# Patient Record
Sex: Female | Born: 1990 | Race: White | Hispanic: No | Marital: Single | Smoking: Never smoker
Health system: Southern US, Community
[De-identification: ages and names within clinical notes are randomized; demographics above are authoritative.]

## PROBLEM LIST (undated history)

## (undated) ENCOUNTER — Inpatient Hospital Stay (HOSPITAL_COMMUNITY): Payer: Self-pay

## (undated) DIAGNOSIS — C9590 Leukemia, unspecified not having achieved remission: Secondary | ICD-10-CM

## (undated) HISTORY — PX: PORT-A-CATH REMOVAL: SHX5289

## (undated) HISTORY — PX: EYE SURGERY: SHX253

---

## 2009-12-15 ENCOUNTER — Inpatient Hospital Stay (HOSPITAL_COMMUNITY): Admission: AD | Admit: 2009-12-15 | Discharge: 2009-12-15 | Payer: Self-pay | Admitting: Obstetrics & Gynecology

## 2009-12-25 ENCOUNTER — Inpatient Hospital Stay (HOSPITAL_COMMUNITY): Admission: AD | Admit: 2009-12-25 | Discharge: 2009-12-28 | Payer: Self-pay | Admitting: Obstetrics and Gynecology

## 2010-11-22 LAB — COMPREHENSIVE METABOLIC PANEL
ALT: 39 U/L — ABNORMAL HIGH (ref 0–35)
ALT: 46 U/L — ABNORMAL HIGH (ref 0–35)
ALT: 54 U/L — ABNORMAL HIGH (ref 0–35)
AST: 27 U/L (ref 0–37)
Alkaline Phosphatase: 115 U/L (ref 39–117)
Alkaline Phosphatase: 78 U/L (ref 39–117)
Alkaline Phosphatase: 85 U/L (ref 39–117)
BUN: 4 mg/dL — ABNORMAL LOW (ref 6–23)
BUN: 7 mg/dL (ref 6–23)
CO2: 19 mEq/L (ref 19–32)
CO2: 22 mEq/L (ref 19–32)
CO2: 23 mEq/L (ref 19–32)
Calcium: 8.4 mg/dL (ref 8.4–10.5)
Chloride: 105 mEq/L (ref 96–112)
Chloride: 105 mEq/L (ref 96–112)
GFR calc Af Amer: >60 mL/min (ref >60)
GFR calc non Af Amer: >60 mL/min (ref >60)
GFR calc non Af Amer: >60 mL/min (ref >60)
GFR calc non Af Amer: >60 mL/min (ref >60)
Glucose, Bld: 90 mg/dL (ref 70–99)
Glucose, Bld: 95 mg/dL (ref 70–99)
Potassium: 3.6 mEq/L (ref 3.5–5.1)
Potassium: 3.6 mEq/L (ref 3.5–5.1)
Potassium: 3.9 mEq/L (ref 3.5–5.1)
Sodium: 134 mEq/L — ABNORMAL LOW (ref 135–145)
Sodium: 135 mEq/L (ref 135–145)
Sodium: 136 mEq/L (ref 135–145)
Total Bilirubin: 0.3 mg/dL (ref 0.3–1.2)
Total Bilirubin: 0.3 mg/dL (ref 0.3–1.2)
Total Protein: 4.8 g/dL — ABNORMAL LOW (ref 6.0–8.3)

## 2010-11-22 LAB — CBC
HCT: 27.9 % — ABNORMAL LOW (ref 36.0–46.0)
HCT: 39.5 % (ref 36.0–46.0)
Hemoglobin: 13.5 g/dL (ref 12.0–15.0)
Hemoglobin: 9.8 g/dL — ABNORMAL LOW (ref 12.0–15.0)
MCHC: 34.4 g/dL (ref 30.0–36.0)
MCV: 90.8 fL (ref 78.0–100.0)
RBC: 3.03 MIL/uL — ABNORMAL LOW (ref 3.87–5.11)
RBC: 3.28 MIL/uL — ABNORMAL LOW (ref 3.87–5.11)
RDW: 13.1 % (ref 11.5–15.5)
WBC: 20.8 10*3/uL — ABNORMAL HIGH (ref 4.0–10.5)
WBC: 22.4 10*3/uL — ABNORMAL HIGH (ref 4.0–10.5)

## 2010-11-22 LAB — HEPATIC FUNCTION PANEL
Bilirubin, Direct: 0.1 mg/dL (ref 0.0–0.3)
Indirect Bilirubin: 0.1 mg/dL — ABNORMAL LOW (ref 0.3–0.9)
Total Bilirubin: 0.2 mg/dL — ABNORMAL LOW (ref 0.3–1.2)

## 2010-11-22 LAB — RH IMMUNE GLOB WKUP(>/=20WKS)(NOT WOMEN'S HOSP): Fetal Screen: NEGATIVE

## 2010-11-22 LAB — LACTATE DEHYDROGENASE: LDH: 168 U/L (ref 94–250)

## 2010-11-22 LAB — ABO/RH: ABO/RH(D): A NEG

## 2010-11-23 LAB — CBC
HCT: 38.8 % (ref 36.0–46.0)
Hemoglobin: 13.5 g/dL (ref 12.0–15.0)
MCV: 91.1 fL (ref 78.0–100.0)
Platelets: 211 10*3/uL (ref 150–400)
RDW: 12.9 % (ref 11.5–15.5)

## 2010-11-23 LAB — URINE MICROSCOPIC-ADD ON

## 2010-11-23 LAB — DIFFERENTIAL
Basophils Absolute: 0 10*3/uL (ref 0.0–0.1)
Basophils Relative: 0 % (ref 0–1)
Lymphocytes Relative: 5 % — ABNORMAL LOW (ref 12–46)
Monocytes Relative: 5 % (ref 3–12)
Neutro Abs: 12.4 10*3/uL — ABNORMAL HIGH (ref 1.7–7.7)
Neutrophils Relative %: 91 % — ABNORMAL HIGH (ref 43–77)

## 2010-11-23 LAB — URINALYSIS, ROUTINE W REFLEX MICROSCOPIC
Glucose, UA: NEGATIVE mg/dL
Ketones, ur: NEGATIVE mg/dL
Nitrite: NEGATIVE
Protein, ur: 100 mg/dL — AB
Urobilinogen, UA: 1 mg/dL (ref 0.0–1.0)

## 2010-11-23 LAB — URINE CULTURE: Colony Count: >=100000

## 2010-11-23 LAB — COMPREHENSIVE METABOLIC PANEL
Alkaline Phosphatase: 102 U/L (ref 39–117)
BUN: 7 mg/dL (ref 6–23)
Creatinine, Ser: 0.59 mg/dL (ref 0.4–1.2)
Glucose, Bld: 130 mg/dL — ABNORMAL HIGH (ref 70–99)
Potassium: 3.6 mEq/L (ref 3.5–5.1)
Total Bilirubin: 0.3 mg/dL (ref 0.3–1.2)
Total Protein: 5.8 g/dL — ABNORMAL LOW (ref 6.0–8.3)

## 2012-07-02 ENCOUNTER — Emergency Department (HOSPITAL_COMMUNITY)
Admission: EM | Admit: 2012-07-02 | Discharge: 2012-07-03 | Disposition: A | Discharge status: Home / Self Care | Payer: BC Managed Care – PPO | Attending: Emergency Medicine | Admitting: Emergency Medicine

## 2012-07-02 ENCOUNTER — Encounter (HOSPITAL_COMMUNITY): Payer: Self-pay

## 2012-07-02 DIAGNOSIS — M549 Dorsalgia, unspecified: Secondary | ICD-10-CM | POA: Insufficient documentation

## 2012-07-02 DIAGNOSIS — O9989 Other specified diseases and conditions complicating pregnancy, childbirth and the puerperium: Secondary | ICD-10-CM | POA: Insufficient documentation

## 2012-07-02 DIAGNOSIS — Z859 Personal history of malignant neoplasm, unspecified: Secondary | ICD-10-CM | POA: Insufficient documentation

## 2012-07-02 DIAGNOSIS — R109 Unspecified abdominal pain: Secondary | ICD-10-CM | POA: Insufficient documentation

## 2012-07-02 DIAGNOSIS — O210 Mild hyperemesis gravidarum: Secondary | ICD-10-CM | POA: Insufficient documentation

## 2012-07-02 HISTORY — DX: Leukemia, unspecified not having achieved remission: C95.90

## 2012-07-02 LAB — URINALYSIS, ROUTINE W REFLEX MICROSCOPIC
Hgb urine dipstick: NEGATIVE
Ketones, ur: >80 mg/dL — AB
Protein, ur: 30 mg/dL — AB
Urobilinogen, UA: 1 mg/dL (ref 0.0–1.0)

## 2012-07-02 LAB — BASIC METABOLIC PANEL
BUN: 8 mg/dL (ref 6–23)
Calcium: 9 mg/dL (ref 8.4–10.5)
Creatinine, Ser: 0.79 mg/dL (ref 0.50–1.10)
GFR calc non Af Amer: >90 mL/min (ref >90)
Glucose, Bld: 83 mg/dL (ref 70–99)

## 2012-07-02 LAB — URINE MICROSCOPIC-ADD ON

## 2012-07-02 MED ORDER — ONDANSETRON HCL 4 MG/2ML IJ SOLN
4.0000 mg | Freq: Once | INTRAMUSCULAR | Status: AC
  Administered 2012-07-02: 4 mg via INTRAVENOUS
  Filled 2012-07-02: qty 2

## 2012-07-02 MED ORDER — OXYCODONE-ACETAMINOPHEN 5-325 MG PO TABS: 2.0000 | ORAL_TABLET | ORAL | Status: DC | PRN

## 2012-07-02 MED ORDER — MORPHINE SULFATE 4 MG/ML IJ SOLN
4.0000 mg | Freq: Once | INTRAMUSCULAR | Status: AC
  Administered 2012-07-02: 4 mg via INTRAVENOUS
  Filled 2012-07-02: qty 1

## 2012-07-02 MED ORDER — SODIUM CHLORIDE 0.9 % IV BOLUS (SEPSIS)
1000.0000 mL | Freq: Once | INTRAVENOUS | Status: AC
  Administered 2012-07-02: 1000 mL via INTRAVENOUS

## 2012-07-02 MED ORDER — PROMETHAZINE HCL 25 MG RE SUPP: 25.0000 mg | Freq: Four times a day (QID) | RECTAL | Status: DC | PRN

## 2012-07-02 NOTE — ED Provider Notes (Signed)
History     CSN: [id number]  Arrival date & time 07/02/12  2108   First MD Initiated Contact with Patient 07/02/12 2122      Chief Complaint  Patient presents with  . Emesis During Pregnancy    (Consider location/radiation/quality/duration/timing/severity/associated sxs/prior treatment) The history is provided by the patient.   21 year old, female , G2, P58, presents emergency department complaining of pain across her lower back, along with nausea and vomiting.  She was just diagnosed with pregnancy, 4 days ago.  She was treated for urinary tract infection, with antibiotics and analgesics.  However, she states that every time she tries to take a medication.  She vomits it back up.  She has not had a cough, or shortness she has not had diarrhea.  She denies dysuria, or hematuria.  Past Medical History  Diagnosis Date  . Leukemia     Past Surgical History  Procedure Date  . Eye surgery     x 4  . 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 No pertinent family history.  History  Substance Use Topics  . Smoking status: Never Smoker   . Smokeless tobacco: Not on file  . Alcohol Use: No    OB History    Grav Para Term Preterm Abortions TAB SAB Ect Mult Living   1               Review of Systems  Constitutional: Negative for fever and chills.  Respiratory: Negative for cough and shortness of breath.   Cardiovascular: Negative for chest pain.  Gastrointestinal: Positive for nausea and vomiting. Negative for abdominal pain and diarrhea.  Genitourinary: Positive for flank pain. Negative for dysuria and hematuria.  Musculoskeletal: Positive for back pain.  Skin: Negative for rash.  Neurological: Negative for headaches.  Psychiatric/Behavioral: Negative for confusion.  All other systems reviewed and are negative.    Allergies  Review of patient's allergies indicates no known allergies.  Home Medications   Current Outpatient Rx  Name  Route Sig Dispense Refill  . NITROFURANTOIN MACROCRYSTAL 100 MG PO CAPS Oral Take 100 mg by mouth 2 (two) times daily.    . OXYCODONE HCL 5 MG PO TABS Oral Take 5 mg by mouth every 4 (four) hours as needed. Pain      BP 131/81  Pulse 103  Temp 98.2 F (36.8 C) (Oral)  Resp 22  SpO2 100%  Physical Exam  Nursing note and vitals reviewed. Constitutional: She is oriented to person, place, and time. She appears well-developed and well-nourished. No distress.  HENT:  Head: Normocephalic and atraumatic.  Eyes: Conjunctivae normal and EOM are normal.  Neck: Normal range of motion. Neck supple.  Cardiovascular: Regular rhythm and intact distal pulses.   No murmur heard.      Tachycardia  Pulmonary/Chest: Effort normal and breath sounds normal. No respiratory distress.  Abdominal: Soft. Bowel sounds are normal. She exhibits no distension. There is no tenderness.  Genitourinary:       Mild bilateral CVA, tenderness  Musculoskeletal: Normal range of motion.  Neurological: She is alert and oriented to person, place, and time. No cranial nerve deficit.  Skin: Skin is warm and dry.  Psychiatric: She has a normal mood and affect. Thought content normal.    ED Course  Procedures (including critical care time) 21 year old, female, G2, P1, presents with hyperemesis.  She is not toxic.  She does not appear to be in any  significant distress.  We will establish an IV and give her antiemetics, and perform laboratory testing, to see if there is any evidence of significant dehydration.   Labs Reviewed  BASIC METABOLIC PANEL  URINALYSIS, ROUTINE W REFLEX MICROSCOPIC  HCG, QUANTITATIVE, PREGNANCY   No results found.   No diagnosis found.    MDM  Hyperemesis gravidarum        Cheri Guppy, MD 07/02/12 2215

## 2012-07-02 NOTE — ED Notes (Signed)
Pt went to urgent care and dx with kidney infection.  Pt then went to [facility] and dx with pregnancy and the kidney infection.  Meds were changed. ie narcotic/antibiotic.  No meds for nausea given for home meds.  Pt states continues to vomit since Saturday.  States ultrasound shows 17 week pregnancy with sex known.  Pt states she had been spotting and did not know she was pregnant.  Concerned for dehydration.  Unable to keep meds down.  Pain from left flank around abdomen.  No spotting.

## 2012-07-02 NOTE — ED Notes (Signed)
MD at bedside. Dr. Caporossi at bedside.  

## 2012-07-03 MED ORDER — CEPHALEXIN 500 MG PO CAPS: 500.0000 mg | ORAL_CAPSULE | Freq: Four times a day (QID) | ORAL | Status: DC

## 2012-09-04 NOTE — L&D Delivery Note (Signed)
Operative Delivery Note At 11:05 AM a viable and healthy female was delivered via Vaginal, Vacuum (Extractor) by Dr. Macon Large due to fetal heart rate decelerations in the 80's.  Presentation: vertex; Position: Right,, Occiput,, Anterior; Station: +3.  Verbal consent: obtained from patient.  Risks and benefits discussed in detail.  Risks include, but are not limited to the risks of anesthesia, bleeding, infection, damage to maternal tissues, fetal cephalhematoma.  There is also the risk of inability to effect vaginal delivery of the head, or shoulder dystocia that cannot be resolved by established maneuvers, leading to the need for emergency cesarean section.  APGAR: 7, 9; weight 7 lb 6 oz (3345 g).   Placenta status: Intact, Spontaneous.   Cord: 3 vessels with the following complications: None.  Cord pH: 7.217  Anesthesia:   Instruments: Epidural Episiotomy: None Lacerations: 3rd degree repaired by Dr. Macon Large Suture Repair: 3.0 Est. Blood Loss (mL): 400  Mom to postpartum.  Baby to nursery-stable.  [facility] 12/04/2012, 2:11 PM

## 2012-11-11 ENCOUNTER — Encounter (HOSPITAL_COMMUNITY): Payer: Self-pay

## 2012-11-11 ENCOUNTER — Inpatient Hospital Stay (HOSPITAL_COMMUNITY)
Admission: AD | Admit: 2012-11-11 | Discharge: 2012-11-11 | Disposition: A | Discharge status: Home / Self Care | Payer: BC Managed Care – PPO | Source: Ambulatory Visit | Attending: Obstetrics and Gynecology | Admitting: Obstetrics and Gynecology

## 2012-11-11 DIAGNOSIS — R109 Unspecified abdominal pain: Secondary | ICD-10-CM | POA: Insufficient documentation

## 2012-11-11 DIAGNOSIS — N390 Urinary tract infection, site not specified: Secondary | ICD-10-CM | POA: Insufficient documentation

## 2012-11-11 LAB — URINALYSIS, ROUTINE W REFLEX MICROSCOPIC
Bilirubin Urine: NEGATIVE
Glucose, UA: NEGATIVE mg/dL
Specific Gravity, Urine: 1.01 (ref 1.005–1.030)
pH: 6.5 (ref 5.0–8.0)

## 2012-11-11 LAB — URINE MICROSCOPIC-ADD ON

## 2012-11-11 MED ORDER — CEPHALEXIN 500 MG PO CAPS: 500.0000 mg | ORAL_CAPSULE | Freq: Four times a day (QID) | ORAL | Status: DC

## 2012-11-11 NOTE — MAU Note (Signed)
Patient states she has been having abdominal pain and lower abdominal pressure today. Denies bleeding or leaking and reports good fetal movement.

## 2012-12-03 ENCOUNTER — Inpatient Hospital Stay (HOSPITAL_COMMUNITY): Payer: BC Managed Care – PPO

## 2012-12-03 ENCOUNTER — Inpatient Hospital Stay (HOSPITAL_COMMUNITY): Payer: BC Managed Care – PPO | Admitting: Anesthesiology

## 2012-12-03 ENCOUNTER — Inpatient Hospital Stay (HOSPITAL_COMMUNITY)
Admission: AD | Admit: 2012-12-03 | Discharge: 2012-12-06 | DRG: 372 | Disposition: A | Discharge status: Home / Self Care | Payer: BC Managed Care – PPO | Source: Ambulatory Visit | Attending: Obstetrics & Gynecology | Admitting: Obstetrics & Gynecology

## 2012-12-03 ENCOUNTER — Encounter (HOSPITAL_COMMUNITY): Payer: Self-pay

## 2012-12-03 ENCOUNTER — Encounter (HOSPITAL_COMMUNITY): Payer: Self-pay | Admitting: Anesthesiology

## 2012-12-03 DIAGNOSIS — D649 Anemia, unspecified: Secondary | ICD-10-CM | POA: Diagnosis not present

## 2012-12-03 DIAGNOSIS — O1403 Mild to moderate pre-eclampsia, third trimester: Secondary | ICD-10-CM

## 2012-12-03 DIAGNOSIS — IMO0002 Reserved for concepts with insufficient information to code with codable children: Secondary | ICD-10-CM

## 2012-12-03 DIAGNOSIS — O368390 Maternal care for abnormalities of the fetal heart rate or rhythm, unspecified trimester, not applicable or unspecified: Secondary | ICD-10-CM

## 2012-12-03 DIAGNOSIS — O9903 Anemia complicating the puerperium: Secondary | ICD-10-CM | POA: Diagnosis not present

## 2012-12-03 LAB — COMPREHENSIVE METABOLIC PANEL
AST: 17 U/L (ref 0–37)
Albumin: 2.4 g/dL — ABNORMAL LOW (ref 3.5–5.2)
BUN: 10 mg/dL (ref 6–23)
Chloride: 105 mEq/L (ref 96–112)
Creatinine, Ser: 0.62 mg/dL (ref 0.50–1.10)
Potassium: 3.9 mEq/L (ref 3.5–5.1)
Total Protein: 6.3 g/dL (ref 6.0–8.3)

## 2012-12-03 LAB — RAPID URINE DRUG SCREEN, HOSP PERFORMED
Amphetamines: NOT DETECTED
Benzodiazepines: NOT DETECTED
Cocaine: NOT DETECTED
Opiates: NOT DETECTED

## 2012-12-03 LAB — CBC
HCT: 36.8 % (ref 36.0–46.0)
MCH: 30.4 pg (ref 26.0–34.0)
MCV: 87.4 fL (ref 78.0–100.0)
RBC: 4.21 MIL/uL (ref 3.87–5.11)
WBC: 11.9 10*3/uL — ABNORMAL HIGH (ref 4.0–10.5)

## 2012-12-03 LAB — URINE MICROSCOPIC-ADD ON

## 2012-12-03 LAB — PROTEIN / CREATININE RATIO, URINE
Protein Creatinine Ratio: 1.98 — ABNORMAL HIGH (ref 0.00–0.15)
Total Protein, Urine: 170 mg/dL

## 2012-12-03 LAB — URINALYSIS, ROUTINE W REFLEX MICROSCOPIC
Nitrite: NEGATIVE
Protein, ur: NEGATIVE mg/dL
Specific Gravity, Urine: 1.025 (ref 1.005–1.030)
Urobilinogen, UA: 0.2 mg/dL (ref 0.0–1.0)

## 2012-12-03 LAB — OB RESULTS CONSOLE GBS: GBS: NEGATIVE

## 2012-12-03 LAB — RAPID HIV SCREEN (WH-MAU): Rapid HIV Screen: NONREACTIVE

## 2012-12-03 LAB — TYPE AND SCREEN: Antibody Screen: NEGATIVE

## 2012-12-03 MED ORDER — NALBUPHINE SYRINGE 5 MG/0.5 ML
10.0000 mg | INJECTION | INTRAMUSCULAR | Status: DC | PRN
  Filled 2012-12-03: qty 1

## 2012-12-03 MED ORDER — FENTANYL 2.5 MCG/ML BUPIVACAINE 1/10 % EPIDURAL INFUSION (WH - ANES)
14.0000 mL/h | INTRAMUSCULAR | Status: DC | PRN
  Administered 2012-12-03 – 2012-12-04 (×2): 14 mL/h via EPIDURAL
  Filled 2012-12-03 (×2): qty 125

## 2012-12-03 MED ORDER — LACTATED RINGERS IV SOLN
INTRAVENOUS | Status: DC
  Administered 2012-12-03 – 2012-12-04 (×4): via INTRAVENOUS

## 2012-12-03 MED ORDER — CITRIC ACID-SODIUM CITRATE 334-500 MG/5ML PO SOLN
30.0000 mL | ORAL | Status: DC | PRN
  Filled 2012-12-03: qty 15

## 2012-12-03 MED ORDER — TERBUTALINE SULFATE 1 MG/ML IJ SOLN
0.2500 mg | Freq: Once | INTRAMUSCULAR | Status: AC | PRN
  Filled 2012-12-03 (×2): qty 1

## 2012-12-03 MED ORDER — MAGNESIUM SULFATE BOLUS VIA INFUSION
4.0000 g | Freq: Once | INTRAVENOUS | Status: AC
  Administered 2012-12-03: 4 g via INTRAVENOUS
  Filled 2012-12-03: qty 500

## 2012-12-03 MED ORDER — IBUPROFEN 600 MG PO TABS: 600.0000 mg | ORAL_TABLET | Freq: Four times a day (QID) | ORAL | Status: DC | PRN

## 2012-12-03 MED ORDER — MAGNESIUM SULFATE 40 G IN LACTATED RINGERS - SIMPLE
2.0000 g/h | INTRAVENOUS | Status: DC
  Administered 2012-12-03: 2 g/h via INTRAVENOUS
  Filled 2012-12-03: qty 500

## 2012-12-03 MED ORDER — OXYCODONE-ACETAMINOPHEN 5-325 MG PO TABS: 1.0000 | ORAL_TABLET | ORAL | Status: DC | PRN

## 2012-12-03 MED ORDER — ACETAMINOPHEN 325 MG PO TABS: 650.0000 mg | ORAL_TABLET | ORAL | Status: DC | PRN

## 2012-12-03 MED ORDER — LACTATED RINGERS IV SOLN
500.0000 mL | INTRAVENOUS | Status: DC | PRN
  Administered 2012-12-03: 300 mL via INTRAVENOUS
  Administered 2012-12-04 (×3): 500 mL via INTRAVENOUS

## 2012-12-03 MED ORDER — OXYTOCIN 40 UNITS IN LACTATED RINGERS INFUSION - SIMPLE MED
62.5000 mL/h | INTRAVENOUS | Status: DC
  Filled 2012-12-03: qty 1000

## 2012-12-03 MED ORDER — LIDOCAINE HCL (PF) 1 % IJ SOLN
30.0000 mL | INTRAMUSCULAR | Status: DC | PRN
  Filled 2012-12-03 (×2): qty 30

## 2012-12-03 MED ORDER — MISOPROSTOL 25 MCG QUARTER TABLET
25.0000 ug | ORAL_TABLET | ORAL | Status: DC | PRN
  Administered 2012-12-03: 25 ug via VAGINAL
  Filled 2012-12-03: qty 1
  Filled 2012-12-03: qty 0.25

## 2012-12-03 MED ORDER — EPHEDRINE 5 MG/ML INJ
10.0000 mg | INTRAVENOUS | Status: DC | PRN
  Filled 2012-12-03: qty 2

## 2012-12-03 MED ORDER — PHENYLEPHRINE 40 MCG/ML (10ML) SYRINGE FOR IV PUSH (FOR BLOOD PRESSURE SUPPORT)
80.0000 ug | PREFILLED_SYRINGE | INTRAVENOUS | Status: DC | PRN
  Filled 2012-12-03: qty 5
  Filled 2012-12-03: qty 2

## 2012-12-03 MED ORDER — PHENYLEPHRINE 40 MCG/ML (10ML) SYRINGE FOR IV PUSH (FOR BLOOD PRESSURE SUPPORT)
80.0000 ug | PREFILLED_SYRINGE | INTRAVENOUS | Status: DC | PRN
  Administered 2012-12-03: 80 ug via INTRAVENOUS
  Filled 2012-12-03: qty 2

## 2012-12-03 MED ORDER — FLEET ENEMA 7-19 GM/118ML RE ENEM: 1.0000 | ENEMA | RECTAL | Status: DC | PRN

## 2012-12-03 MED ORDER — SODIUM BICARBONATE 8.4 % IV SOLN
INTRAVENOUS | Status: DC | PRN
  Administered 2012-12-03: 5 mL via EPIDURAL

## 2012-12-03 MED ORDER — DIPHENHYDRAMINE HCL 50 MG/ML IJ SOLN: 12.5000 mg | INTRAMUSCULAR | Status: DC | PRN

## 2012-12-03 MED ORDER — OXYTOCIN BOLUS FROM INFUSION: 500.0000 mL | INTRAVENOUS | Status: DC

## 2012-12-03 MED ORDER — ONDANSETRON HCL 4 MG/2ML IJ SOLN
4.0000 mg | Freq: Four times a day (QID) | INTRAMUSCULAR | Status: DC | PRN
  Administered 2012-12-04: 4 mg via INTRAVENOUS
  Filled 2012-12-03: qty 2

## 2012-12-03 MED ORDER — EPHEDRINE 5 MG/ML INJ
10.0000 mg | INTRAVENOUS | Status: DC | PRN
  Filled 2012-12-03: qty 4
  Filled 2012-12-03: qty 2

## 2012-12-03 MED ORDER — LACTATED RINGERS IV SOLN
500.0000 mL | Freq: Once | INTRAVENOUS | Status: AC
  Administered 2012-12-03: 500 mL via INTRAVENOUS

## 2012-12-03 NOTE — Anesthesia Preprocedure Evaluation (Signed)
Anesthesia Evaluation  Patient identified by MRN, date of birth, ID band Patient awake    Reviewed: Allergy & Precautions, H&P , Patient's Chart, lab work & pertinent test results  Airway Mallampati: II  TM Distance: >3 FB Neck ROM: full    Dental  (+) Teeth Intact   Pulmonary    breath sounds clear to auscultation       Cardiovascular hypertension,  Rhythm:regular Rate:Normal     Neuro/Psych    GI/Hepatic   Endo/Other  Morbid obesity  Renal/GU      Musculoskeletal   Abdominal   Peds  Hematology   Anesthesia Other Findings       Reproductive/Obstetrics (+) Pregnancy                             Anesthesia Physical Anesthesia Plan  ASA: III  Anesthesia Plan: Epidural   Post-op Pain Management:    Induction:   Airway Management Planned:   Additional Equipment:   Intra-op Plan:   Post-operative Plan:   Informed Consent: I have reviewed the patients History and Physical, chart, labs and discussed the procedure including the risks, benefits and alternatives for the proposed anesthesia with the patient or authorized representative who has indicated his/her understanding and acceptance.   Dental Advisory Given  Plan Discussed with:   Anesthesia Plan Comments: (Labs checked- platelets confirmed with RN in room. Fetal heart tracing, per RN, reported to be stable enough for sitting procedure. Discussed epidural, and patient consents to the procedure:  included risk of possible headache,backache, failed block, allergic reaction, and nerve injury. This patient was asked if she had any questions or concerns before the procedure started.)        Anesthesia Quick Evaluation  

## 2012-12-03 NOTE — H&P (Signed)
Krista Herring is a 22 y.o. female G2P0101 at [redacted]w[redacted]d presenting to MAU for lower abdominal pain and low back pain that started at 3am this morning. She tried drinking water as she thought it was Braxton-Hicks contractions but it did not help. Denies bleeding. Has had increased quantity of vaginal discharge over the last few days, watery. Has been feeling baby move.  LMP is not sure - thinks June or July. Says she had an ultrasound at [facility] to give her a due date of April 7. Has not had any additional scans this pregnancy. Prenatal care obtained at [facility] for several visits, but due to insurance concerns pt transferred care to health department for "a few" visits.  Denies headache, vision changes, or any other symptoms (full ROS negative except as mentioned above).   History OB History   Grav Para Term Preterm Abortions TAB SAB Ect Mult Living   2 1  1      1     Last pregnancy was 3 years ago - born 3-4 weeks early (per discharge summary, 37 weeks). 5lb 9oz. No problems during the pregnancy but mom reports she was admitted to ICU after delivery for elevated blood pressures. Received magnesium for 24 hours postpartum. That child is doing well.  Medications: Tums & PNV  Past Medical History  Diagnosis Date  . 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  Past Surgical History  Procedure Laterality Date  . Eye surgery      x 4  . Port-a-cath removal      for leukemia tx at age 56   Family History: family history is not on file. Social History: Denies smoking, drugs, or alcohol use.  Prenatal Transfer Tool  Maternal Diabetes: No (not tested) Genetic Screening: unknown Maternal Ultrasounds/Referrals: did not receive anatomy scan (just limited OB scan at 16+5) Fetal Ultrasounds or other Referrals:  None Maternal Substance Abuse:  No Significant Maternal Medications:  None Significant Maternal Lab Results:  Lab  values include: Rh negative Other Comments:  prenatal care limited (no anatomy scan, unsure what labs have been done)  ROS see HPI  Dilation: 1 Effacement (%): 50 Exam by:: dr Pink Maye Blood pressure 159/91, pulse 101, temperature 98.4 F (36.9 C), temperature source Oral, resp. rate 18, height 5\' 3"  (1.6 m), weight 220 lb (99.791 kg), SpO2 98.00%. Exam Physical Exam   Vitals:  BP 140-150s/90-100s. High 159 SBP, 103 DBP. Gen: NAD Heart: RRR Lungs: CTAB, NWOB Abd: gravid, nontender to palpation GU: see cervical exam above. No pooling. Does have yeast-like discharge present. Ext: 2+ pitting edema bilaterally  Vertex presentation confirmed by bedside u/s. Records obtained from [facility] which confirm pt's EDC of 12/09/2012 via scan at 16+5 weeks. FHTs:  120-130, moderate variability, accels present, no decels TOCO:  Irritability, occasional contraction   Prenatal labs: None available  Assessment/Plan:  Multiple elevated blood pressures in MAU. Since best dating available suggests current gestational age of 39+1 and elevated BP's will admit to L&D for induction of labor.  -check all prenatal labs now (including GBS), PIH labs as well, rapid GBS -monitor BP closely. Will hold off on magnesium at this time. -IOL with cytotec, possibly foley bulb -Rhogam postpartum for maternal A- blood type if baby is Rh+  Levert Feinstein 12/03/2012, 4:41 PM  I saw and examined patient and agree with above resident note and plan. I reviewed history, imaging, labs, and vitals.  I personally reviewed the fetal heart tracing, and it is reactive (category I). Napoleon Form, MD

## 2012-12-03 NOTE — MAU Note (Signed)
Patient is in with c/o constant lower back and abdominal cramping. She denies vaginal bleeding or lof. She reports good fetal movement. Patient have insufficient prenatal care. She states that its due to insurance reasons. She states that she had 2 visits at the [facility] and 4 at the health dept. She had early pregnant ultrasound. No anatomy scan, no glucola screening or blood work. She states that she a vaginal delivery in 2011 3 weeks early. She states that the baby had a low birth weight without stay at the NICU.

## 2012-12-03 NOTE — Progress Notes (Signed)
Dr Pollie Meyer notified of patient. Dr was in delivery, arrived at MAU at this time

## 2012-12-03 NOTE — Progress Notes (Signed)
Krista Herring is a 22 y.o. G2P0101 at [redacted]w[redacted]d   Subjective: Requesting epidural now; received 1 dose cytotec; mag sulfate infusing  Objective: BP 138/95  Pulse 102  Temp(Src) 98.1 F (36.7 C) (Oral)  Resp 18  Ht 5\' 3"  (1.6 m)  Wt 220 lb (99.791 kg)  BMI 38.98 kg/m2  SpO2 97%  recent BP 138/88    FHT:  FHR: 120 bpm, variability: moderate,  accelerations:  Present,  decelerations:  Absent UC:   regular, every 1-3 minutes SVE:   Dilation: 3.5 Effacement (%): 70 Station: -2 Exam by:: L. Dupell, RN  Labs: Lab Results  Component Value Date   WBC 11.9* 12/03/2012   HGB 12.8 12/03/2012   HCT 36.8 12/03/2012   MCV 87.4 12/03/2012   PLT 216 12/03/2012    Assessment / Plan: IUP at term Preeclampsia IOL process  Have epidural placed by anesthesia Monitor BPs May need Pitocin prn  Cam Hai 12/03/2012, 9:19 PM

## 2012-12-03 NOTE — MAU Note (Signed)
Pt states abd pain since 0300 this am, pain worse now. Has burning and urgency with voiding.

## 2012-12-03 NOTE — Anesthesia Procedure Notes (Signed)

## 2012-12-04 ENCOUNTER — Encounter (HOSPITAL_COMMUNITY): Payer: Self-pay | Admitting: Advanced Practice Midwife

## 2012-12-04 LAB — RPR: RPR Ser Ql: NONREACTIVE

## 2012-12-04 LAB — URINE CULTURE: Colony Count: >=100000

## 2012-12-04 LAB — RUBELLA SCREEN: Rubella: 0.8 Index (ref <0.90)

## 2012-12-04 MED ORDER — FENTANYL CITRATE 0.05 MG/ML IJ SOLN: 100.0000 ug | Freq: Once | INTRAMUSCULAR | Status: DC

## 2012-12-04 MED ORDER — BENZOCAINE-MENTHOL 20-0.5 % EX AERO
1.0000 "application " | INHALATION_SPRAY | CUTANEOUS | Status: DC | PRN
  Administered 2012-12-04: 1 via TOPICAL
  Filled 2012-12-04: qty 56

## 2012-12-04 MED ORDER — LANOLIN HYDROUS EX OINT: TOPICAL_OINTMENT | CUTANEOUS | Status: DC | PRN

## 2012-12-04 MED ORDER — LACTATED RINGERS IV SOLN
INTRAVENOUS | Status: DC
  Administered 2012-12-04 – 2012-12-05 (×2): via INTRAVENOUS

## 2012-12-04 MED ORDER — WITCH HAZEL-GLYCERIN EX PADS: 1.0000 "application " | MEDICATED_PAD | CUTANEOUS | Status: DC | PRN

## 2012-12-04 MED ORDER — DIPHENHYDRAMINE HCL 25 MG PO CAPS: 25.0000 mg | ORAL_CAPSULE | Freq: Four times a day (QID) | ORAL | Status: DC | PRN

## 2012-12-04 MED ORDER — TETANUS-DIPHTH-ACELL PERTUSSIS 5-2.5-18.5 LF-MCG/0.5 IM SUSP
0.5000 mL | Freq: Once | INTRAMUSCULAR | Status: AC
  Administered 2012-12-05: 0.5 mL via INTRAMUSCULAR
  Filled 2012-12-04: qty 0.5

## 2012-12-04 MED ORDER — OXYCODONE-ACETAMINOPHEN 5-325 MG PO TABS: 1.0000 | ORAL_TABLET | ORAL | Status: DC | PRN

## 2012-12-04 MED ORDER — TERBUTALINE SULFATE 1 MG/ML IJ SOLN
0.2500 mg | Freq: Once | INTRAMUSCULAR | Status: AC
  Administered 2012-12-04: 0.25 mg via SUBCUTANEOUS

## 2012-12-04 MED ORDER — OXYTOCIN 40 UNITS IN LACTATED RINGERS INFUSION - SIMPLE MED
1.0000 m[IU]/min | INTRAVENOUS | Status: DC
  Administered 2012-12-04: 2 m[IU]/min via INTRAVENOUS

## 2012-12-04 MED ORDER — ZOLPIDEM TARTRATE 5 MG PO TABS: 5.0000 mg | ORAL_TABLET | Freq: Every evening | ORAL | Status: DC | PRN

## 2012-12-04 MED ORDER — SODIUM BICARBONATE 8.4 % IV SOLN
INTRAVENOUS | Status: DC | PRN
  Administered 2012-12-04: 5 mL via EPIDURAL

## 2012-12-04 MED ORDER — DIBUCAINE 1 % RE OINT: 1.0000 "application " | TOPICAL_OINTMENT | RECTAL | Status: DC | PRN

## 2012-12-04 MED ORDER — FENTANYL CITRATE 0.05 MG/ML IJ SOLN
INTRAMUSCULAR | Status: AC
  Filled 2012-12-04: qty 2

## 2012-12-04 MED ORDER — FENTANYL CITRATE 0.05 MG/ML IJ SOLN
INTRAMUSCULAR | Status: DC | PRN
  Administered 2012-12-04: 100 ug via EPIDURAL

## 2012-12-04 MED ORDER — TERBUTALINE SULFATE 1 MG/ML IJ SOLN: 0.2500 mg | Freq: Once | INTRAMUSCULAR | Status: DC | PRN

## 2012-12-04 MED ORDER — LACTATED RINGERS IV SOLN: INTRAVENOUS | Status: DC

## 2012-12-04 MED ORDER — PRENATAL MULTIVITAMIN CH
1.0000 | ORAL_TABLET | Freq: Every day | ORAL | Status: DC
  Administered 2012-12-04 – 2012-12-06 (×3): 1 via ORAL
  Filled 2012-12-04 (×3): qty 1

## 2012-12-04 MED ORDER — LACTATED RINGERS IV SOLN
INTRAVENOUS | Status: DC
  Administered 2012-12-04: 01:00:00 via INTRAUTERINE

## 2012-12-04 MED ORDER — ONDANSETRON HCL 4 MG/2ML IJ SOLN: 4.0000 mg | INTRAMUSCULAR | Status: DC | PRN

## 2012-12-04 MED ORDER — SIMETHICONE 80 MG PO CHEW: 80.0000 mg | CHEWABLE_TABLET | ORAL | Status: DC | PRN

## 2012-12-04 MED ORDER — MAGNESIUM SULFATE 40 G IN LACTATED RINGERS - SIMPLE
2.0000 g/h | INTRAVENOUS | Status: DC
  Administered 2012-12-04: 2 g/h via INTRAVENOUS
  Filled 2012-12-04 (×2): qty 500

## 2012-12-04 MED ORDER — ONDANSETRON HCL 4 MG PO TABS: 4.0000 mg | ORAL_TABLET | ORAL | Status: DC | PRN

## 2012-12-04 MED ORDER — IBUPROFEN 600 MG PO TABS
600.0000 mg | ORAL_TABLET | Freq: Four times a day (QID) | ORAL | Status: DC
  Administered 2012-12-04 – 2012-12-06 (×6): 600 mg via ORAL
  Filled 2012-12-04 (×8): qty 1

## 2012-12-04 MED ORDER — SENNOSIDES-DOCUSATE SODIUM 8.6-50 MG PO TABS
2.0000 | ORAL_TABLET | Freq: Every day | ORAL | Status: DC
  Administered 2012-12-04 – 2012-12-05 (×2): 2 via ORAL

## 2012-12-04 NOTE — Anesthesia Postprocedure Evaluation (Signed)
Anesthesia Post Note  Patient: ANSHIKA PETHTEL  Procedure(s) Performed: * No procedures listed *  Anesthesia type: Epidural  Patient location: Mother/Baby  Post pain: Pain level controlled  Post assessment: Post-op Vital signs reviewed  Last Vitals: BP 128/70  Pulse 80  Temp(Src) 36.8 C (Oral)  Resp 18  Ht 5\' 3"  (1.6 m)  Wt 214 lb 9.6 oz (97.342 kg)  BMI 38.02 kg/m2  SpO2 97%  Post vital signs: Reviewed  Level of consciousness: awake  Complications: No apparent anesthesia complicationsAnesthesia Post Note  Patient: Lawrence Santiago  Procedure(s) Performed: * No procedures listed *  Anesthesia type: Epidural  Patient location: Mother/Baby  Post pain: Pain level controlled  Post assessment: Post-op Vital signs reviewed  Last Vitals: BP 128/70  Pulse 80  Temp(Src) 36.8 C (Oral)  Resp 18  Ht 5\' 3"  (1.6 m)  Wt 214 lb 9.6 oz (97.342 kg)  BMI 38.02 kg/m2  SpO2 97%  Post vital signs: Reviewed  Level of consciousness: awake  Complications: No apparent anesthesia complications

## 2012-12-04 NOTE — Progress Notes (Signed)
Patient ID: Krista Herring, female   DOB: 12-Jul-1991, 22 y.o.   MRN: [id number] CTSP for prolonged decel to nadir of 60 with an average of 80-90s x 6 mins followed by brief periods of recovery to 120s only to decel again. This began with SROM.  Internal monitors placed; trendelenberg used; O2 placed; terb given; amnioinfusion ordered. Cx 5cm/80/vtx -2 with no cord palpated during exam; +SS. FHR has stablized for now with only early variables. Will continue to monitor.  Cam Hai 12/04/2012 1:03 AM

## 2012-12-04 NOTE — Progress Notes (Signed)
Pt rec'd from L&D via w/c postpartum for magnesium Tx - report rec'd from [facility], RN

## 2012-12-04 NOTE — Progress Notes (Signed)
Ur chart review completed.  

## 2012-12-04 NOTE — Progress Notes (Signed)
Patient ID: Krista Herring, female   DOB: September 20, 1990, 22 y.o.   MRN: [id number] CTSP for FHR eval as mom/baby both tracing around 110.  IFSE replaced; cx 9/0; +SS FHR stablized at 115-120 Examine cx in approx 1-2 hours  Alexzandra Bilton, [facility] 12/04/2012 4:03 AM

## 2012-12-05 LAB — CBC
Hemoglobin: 8.7 g/dL — ABNORMAL LOW (ref 12.0–15.0)
MCH: 30.5 pg (ref 26.0–34.0)
MCV: 87 fL (ref 78.0–100.0)
RBC: 2.85 MIL/uL — ABNORMAL LOW (ref 3.87–5.11)

## 2012-12-05 MED ORDER — SODIUM CHLORIDE 0.9 % IJ SOLN
3.0000 mL | Freq: Two times a day (BID) | INTRAMUSCULAR | Status: DC
  Administered 2012-12-05 (×2): 3 mL via INTRAVENOUS

## 2012-12-05 MED ORDER — MEASLES, MUMPS & RUBELLA VAC ~~LOC~~ INJ
0.5000 mL | INJECTION | Freq: Once | SUBCUTANEOUS | Status: AC
  Administered 2012-12-06: 0.5 mL via SUBCUTANEOUS
  Filled 2012-12-05 (×2): qty 0.5

## 2012-12-05 MED ORDER — SODIUM CHLORIDE 0.9 % IJ SOLN: 3.0000 mL | INTRAMUSCULAR | Status: DC | PRN

## 2012-12-05 NOTE — Progress Notes (Signed)
Post Partum Day 1 Subjective: no complaints, up ad lib and voiding. In AICU on Mag. Breastfeeding going well.  Objective: Blood pressure 125/72, pulse 74, temperature 98.2 F (36.8 C), temperature source Oral, resp. rate 17, height 5\' 3"  (1.6 m), weight 214 lb 9.6 oz (97.342 kg), SpO2 98.00%, unknown if currently breastfeeding.  Physical Exam:  General: alert, cooperative and no distress Lochia: appropriate Uterine Fundus: firm DVT Evaluation: No evidence of DVT seen on physical exam.   Recent Labs  12/03/12 1535 12/05/12 0510  HGB 12.8 8.7*  HCT 36.8 24.8*    Assessment/Plan: Preeclampsia - mag off at 24 hours PP (11 AM today), BP stable 4 pt drop in hgb postpartum, asymptomatic, VSS Breastfeeding well Plan for d/c tomorrow if stable  LOS: 2 days   Tashe Purdon 12/05/2012, 7:43 AM

## 2012-12-05 NOTE — Progress Notes (Signed)
Received room assignment #143 for transfer of pt to [facility]. Receiving RN is in shift report at this time - will call back for report.

## 2012-12-06 LAB — CBC
HCT: 23.6 % — ABNORMAL LOW (ref 36.0–46.0)
Hemoglobin: 8.2 g/dL — ABNORMAL LOW (ref 12.0–15.0)
MCV: 89.1 fL (ref 78.0–100.0)
RDW: 13.6 % (ref 11.5–15.5)
WBC: 11.8 10*3/uL — ABNORMAL HIGH (ref 4.0–10.5)

## 2012-12-06 MED ORDER — INTEGRA F 125-1 MG PO CAPS: 1.0000 | ORAL_CAPSULE | Freq: Every day | ORAL | Status: AC

## 2012-12-06 MED ORDER — ACETAMINOPHEN-CODEINE 300-30 MG PO TABS: 1.0000 | ORAL_TABLET | ORAL | Status: DC | PRN

## 2012-12-06 MED ORDER — IBUPROFEN 600 MG PO TABS: 600.0000 mg | ORAL_TABLET | Freq: Four times a day (QID) | ORAL | Status: AC

## 2012-12-06 MED ORDER — RHO D IMMUNE GLOBULIN 1500 UNIT/2ML IJ SOLN
300.0000 ug | Freq: Once | INTRAMUSCULAR | Status: AC
  Administered 2012-12-06: 300 ug via INTRAMUSCULAR
  Filled 2012-12-06: qty 2

## 2012-12-06 NOTE — Discharge Summary (Signed)
Attestation of Attending Supervision of Advanced Practitioner (CNM/NP): Evaluation and management procedures were performed by the Advanced Practitioner under my supervision and collaboration.  I have reviewed the Advanced Practitioner's note and chart, and I agree with the management and plan.  Jervis Trapani 12/06/2012 7:35 PM

## 2012-12-06 NOTE — Clinical Social Work Maternal (Signed)
     Clinical Social Work Department PSYCHOSOCIAL ASSESSMENT - MATERNAL/CHILD 12/06/2012  Patient:  Krista, Herring  Account Number:  [id number]  Admit Date:  12/03/2012  Marjo Bicker Name:   Krista Herring    Clinical Social Worker:  Nobie Putnam, LCSW   Date/Time:  12/06/2012 12:29 PM  Date Referred:  12/06/2012   Referral source  CN     Referred reason  Other - See comment   Other referral source:    I:  FAMILY / HOME ENVIRONMENT Marjo Bicker legal guardian:  PARENT  Guardian - Name Guardian - Age Guardian - Address  Krista Herring 21 [address] [place], [place] [address]  Krista Herring 20    Other household support members/support persons Name Relationship DOB  Krista Herring 12/25/09  Krista Herring FATHER   Krista Herring MOTHER    Other support:   Family    II  PSYCHOSOCIAL DATA Information Source:  Patient Interview  Event organiser Employment:   [employer]   Financial resources:  [employer] If [employer] - [place]:    School / Grade:   Maternity Care Coordinator / Child Services Coordination / Early Interventions:  Cultural issues impacting care:    III  STRENGTHS Strengths  Adequate Resources  Home prepared for Child (including basic supplies)  Supportive family/friends   Strength comment:    IV  RISK FACTORS AND CURRENT PROBLEMS Current Problem:  YES   Risk Factor & Current Problem Patient Issue Family Issue Risk Factor / Current Problem Comment  Other - See comment Y N Limited PNC   N N     V  SOCIAL WORK ASSESSMENT CSW referral received to assess pt's reason for limited PNC.  Pt explained that she started going to the Health Department before transferring to [facility].  Once she transferred, she was able to attend a couple of appointments with an "inactive" Medicaid card before the office denied future appointments.  She could not pay for appointments out-of-pocket.  She denies illegal substance use  & verbalized understanding of hospital drug testing policy.  UDS is negative, meconium results are pending.  Pt has all the necessary supplies for the infant & good family support.  CSW will continue to monitor drug screen results & make a report if needed.      VI SOCIAL WORK PLAN Social Work Plan  No Further Intervention Required / No Barriers to Discharge   Type of pt/family education:   If child protective services report - county:   If child protective services report - date:   Information/referral to community resources comment:   Other social work plan:

## 2012-12-06 NOTE — Discharge Summary (Signed)
Obstetric Discharge Summary  KALIYAN OSBOURN is a 22 y.o. G2P002 who delivered a female at [redacted]w[redacted]d.  Reason for Admission: onset of labor Prenatal Procedures: none Intrapartum Procedures: vacum assisted vaginal delivery  Postpartum Procedures: magnesium preeclamp Complications-Operative and Postpartum: mild preeclampsia Hemoglobin  Date Value Range Status  12/06/2012 8.2* 12.0 - 15.0 g/dL Final     HCT  Date Value Range Status  12/06/2012 23.6* 36.0 - 46.0 % Final    Physical Exam:  General: alert, cooperative, appears stated age and no distress Lochia: appropriate Uterine Fundus: firm DVT Evaluation: No evidence of DVT seen on physical exam. No cords or calf tenderness.  Discharge Diagnoses: Term Pregnancy-delivered and Preelampsia and Anemia  Discharge Information: Date: 12/06/2012 Activity: pelvic rest Diet: routine Medications: Ibuprofen, Colace and Iron Condition: stable Instructions: refer to practice specific booklet Discharge to: home   Newborn Data: Live born female  Birth Weight: 7 lb 6 oz (3345 g) APGAR: 7, 9  Home with mother.  Rosalee Kaufman 12/06/2012, 8:07 AM  I examined pt and agree with documentation above and PA-S plan of care. [facility]

## 2012-12-07 LAB — RH IG WORKUP (INCLUDES ABO/RH)
Fetal Screen: NEGATIVE
Unit division: 0

## 2014-07-06 ENCOUNTER — Encounter (HOSPITAL_COMMUNITY): Payer: Self-pay | Admitting: Advanced Practice Midwife

## 2022-10-25 ENCOUNTER — Emergency Department (HOSPITAL_COMMUNITY)
Admission: EM | Admit: 2022-10-25 | Discharge: 2022-10-26 | Disposition: A | Discharge status: Home / Self Care | Payer: No Typology Code available for payment source | Attending: Emergency Medicine | Admitting: Emergency Medicine

## 2022-10-25 ENCOUNTER — Encounter (HOSPITAL_COMMUNITY): Payer: Self-pay

## 2022-10-25 ENCOUNTER — Emergency Department (HOSPITAL_COMMUNITY): Payer: No Typology Code available for payment source

## 2022-10-25 DIAGNOSIS — S62115A Nondisplaced fracture of triquetrum [cuneiform] bone, left wrist, initial encounter for closed fracture: Secondary | ICD-10-CM

## 2022-10-25 DIAGNOSIS — M79651 Pain in right thigh: Secondary | ICD-10-CM | POA: Diagnosis not present

## 2022-10-25 DIAGNOSIS — Y92410 Unspecified street and highway as the place of occurrence of the external cause: Secondary | ICD-10-CM | POA: Diagnosis not present

## 2022-10-25 DIAGNOSIS — Z856 Personal history of leukemia: Secondary | ICD-10-CM | POA: Diagnosis not present

## 2022-10-25 DIAGNOSIS — M25532 Pain in left wrist: Secondary | ICD-10-CM | POA: Diagnosis present

## 2022-10-25 NOTE — ED Provider Triage Note (Signed)
Emergency Medicine Provider Triage Evaluation Note  Krista Herring , a 32 y.o. female  was evaluated in triage.  Pt complains of pain in left hand and right knee.    Review of Systems  Positive: No loc  Negative: Chest or abdominal pain   Physical Exam  BP 134/82 (BP Location: Right Arm)   Pulse 94   Temp 98.3 F (36.8 C)   Resp 20   Ht 5' 3"$  (1.6 m)   Wt 112 kg   SpO2 98%   BMI 43.75 kg/m  Gen:   Awake, no distress   Resp:  Normal effort  MSK:   Abrasion right knee,  swollen tender left hand  Other:    Medical Decision Making  Medically screening exam initiated at 7:17 PM.  Appropriate orders placed.  Krista Herring was informed that the remainder of the evaluation will be completed by another provider, this initial triage assessment does not replace that evaluation, and the importance of remaining in the ED until their evaluation is complete.     Fransico Meadow, [place] 10/25/22 1920
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
## 2022-10-26 MED ORDER — KETOROLAC TROMETHAMINE 60 MG/2ML IM SOLN
30.0000 mg | Freq: Once | INTRAMUSCULAR | Status: AC
  Administered 2022-10-26: 30 mg via INTRAMUSCULAR
  Filled 2022-10-26: qty 2

## 2022-10-26 MED ORDER — HYDROCODONE-ACETAMINOPHEN 5-325 MG PO TABS: 1.0000 | ORAL_TABLET | Freq: Once | ORAL | Status: DC

## 2022-10-26 MED ORDER — OXYCODONE HCL 5 MG PO TABS: 2.5000 mg | ORAL_TABLET | Freq: Four times a day (QID) | ORAL | 0 refills | Status: AC | PRN

## 2022-10-26 MED ORDER — ACETAMINOPHEN 500 MG PO TABS
1000.0000 mg | ORAL_TABLET | Freq: Once | ORAL | Status: AC
  Administered 2022-10-26: 1000 mg via ORAL
  Filled 2022-10-26: qty 2
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
## 2022-10-26 NOTE — Discharge Instructions (Addendum)
For pain control you may take 1000 mg of Tylenol every 8 hours scheduled.  In addition you can take 0.5 to 1 tablet of Oxycodone every 6 hours as needed for pain not controlled with the scheduled Tylenol.
